# Patient Record
Sex: Female | Born: 1960 | Race: White | Hispanic: No
Health system: Southern US, Community
[De-identification: ages and names within clinical notes are randomized; demographics above are authoritative.]

---

## 2013-04-24 ENCOUNTER — Other Ambulatory Visit (HOSPITAL_COMMUNITY): Payer: Self-pay | Admitting: Family Medicine

## 2013-04-24 ENCOUNTER — Ambulatory Visit (HOSPITAL_COMMUNITY)
Admission: RE | Admit: 2013-04-24 | Discharge: 2013-04-24 | Disposition: A | Discharge status: Home / Self Care | Payer: Disability Insurance | Source: Ambulatory Visit | Attending: Family Medicine | Admitting: Family Medicine

## 2013-04-24 DIAGNOSIS — Q762 Congenital spondylolisthesis: Secondary | ICD-10-CM | POA: Insufficient documentation

## 2013-04-24 DIAGNOSIS — M25561 Pain in right knee: Secondary | ICD-10-CM

## 2013-04-24 DIAGNOSIS — M545 Low back pain, unspecified: Secondary | ICD-10-CM

## 2013-04-24 DIAGNOSIS — M1710 Unilateral primary osteoarthritis, unspecified knee: Secondary | ICD-10-CM | POA: Insufficient documentation

## 2013-04-24 DIAGNOSIS — M25569 Pain in unspecified knee: Secondary | ICD-10-CM | POA: Insufficient documentation

## 2013-04-24 DIAGNOSIS — M5137 Other intervertebral disc degeneration, lumbosacral region: Secondary | ICD-10-CM | POA: Insufficient documentation

## 2013-04-24 DIAGNOSIS — M51379 Other intervertebral disc degeneration, lumbosacral region without mention of lumbar back pain or lower extremity pain: Secondary | ICD-10-CM | POA: Insufficient documentation

## 2013-04-24 DIAGNOSIS — IMO0002 Reserved for concepts with insufficient information to code with codable children: Secondary | ICD-10-CM | POA: Insufficient documentation

## 2014-05-19 ENCOUNTER — Other Ambulatory Visit (HOSPITAL_COMMUNITY): Payer: Self-pay | Admitting: Physician Assistant

## 2014-05-19 DIAGNOSIS — Z1231 Encounter for screening mammogram for malignant neoplasm of breast: Secondary | ICD-10-CM

## 2014-06-18 ENCOUNTER — Ambulatory Visit (HOSPITAL_COMMUNITY): Payer: Disability Insurance

## 2014-06-29 ENCOUNTER — Ambulatory Visit (HOSPITAL_COMMUNITY)
Admission: RE | Admit: 2014-06-29 | Discharge: 2014-06-29 | Disposition: A | Discharge status: Home / Self Care | Payer: Medicaid Other | Source: Ambulatory Visit | Attending: Physician Assistant | Admitting: Physician Assistant

## 2014-06-29 DIAGNOSIS — Z1231 Encounter for screening mammogram for malignant neoplasm of breast: Secondary | ICD-10-CM | POA: Diagnosis not present

## 2014-07-19 DIAGNOSIS — Z9889 Other specified postprocedural states: Secondary | ICD-10-CM | POA: Diagnosis not present

## 2014-07-19 DIAGNOSIS — R200 Anesthesia of skin: Secondary | ICD-10-CM | POA: Diagnosis not present

## 2014-07-19 DIAGNOSIS — R51 Headache: Secondary | ICD-10-CM | POA: Diagnosis not present

## 2014-07-20 DIAGNOSIS — R479 Unspecified speech disturbances: Secondary | ICD-10-CM | POA: Diagnosis not present

## 2014-07-20 DIAGNOSIS — R200 Anesthesia of skin: Secondary | ICD-10-CM | POA: Diagnosis not present

## 2014-10-20 ENCOUNTER — Ambulatory Visit: Payer: Self-pay | Admitting: Physician Assistant

## 2016-03-16 DIAGNOSIS — Z0000 Encounter for general adult medical examination without abnormal findings: Secondary | ICD-10-CM | POA: Diagnosis not present

## 2016-05-22 DIAGNOSIS — E854 Organ-limited amyloidosis: Secondary | ICD-10-CM | POA: Diagnosis not present

## 2016-05-22 DIAGNOSIS — G939 Disorder of brain, unspecified: Secondary | ICD-10-CM | POA: Diagnosis not present

## 2016-05-22 DIAGNOSIS — I680 Cerebral amyloid angiopathy: Secondary | ICD-10-CM | POA: Diagnosis not present

## 2016-05-22 DIAGNOSIS — Z8679 Personal history of other diseases of the circulatory system: Secondary | ICD-10-CM | POA: Diagnosis not present

## 2016-05-22 DIAGNOSIS — F321 Major depressive disorder, single episode, moderate: Secondary | ICD-10-CM | POA: Diagnosis not present

## 2016-05-22 DIAGNOSIS — R51 Headache: Secondary | ICD-10-CM | POA: Diagnosis not present

## 2016-05-22 DIAGNOSIS — I10 Essential (primary) hypertension: Secondary | ICD-10-CM | POA: Diagnosis not present

## 2016-06-01 DIAGNOSIS — R51 Headache: Secondary | ICD-10-CM | POA: Diagnosis not present

## 2016-06-01 DIAGNOSIS — R9082 White matter disease, unspecified: Secondary | ICD-10-CM | POA: Diagnosis not present

## 2016-06-01 DIAGNOSIS — R413 Other amnesia: Secondary | ICD-10-CM | POA: Diagnosis not present

## 2016-06-01 DIAGNOSIS — I639 Cerebral infarction, unspecified: Secondary | ICD-10-CM | POA: Diagnosis not present

## 2016-06-01 DIAGNOSIS — R419 Unspecified symptoms and signs involving cognitive functions and awareness: Secondary | ICD-10-CM | POA: Diagnosis not present

## 2016-06-01 DIAGNOSIS — I638 Other cerebral infarction: Secondary | ICD-10-CM | POA: Diagnosis not present

## 2016-06-13 DIAGNOSIS — I10 Essential (primary) hypertension: Secondary | ICD-10-CM | POA: Diagnosis not present

## 2016-06-13 DIAGNOSIS — F321 Major depressive disorder, single episode, moderate: Secondary | ICD-10-CM | POA: Diagnosis not present

## 2016-06-13 DIAGNOSIS — R0602 Shortness of breath: Secondary | ICD-10-CM | POA: Diagnosis not present

## 2016-06-13 DIAGNOSIS — I6340 Cerebral infarction due to embolism of unspecified cerebral artery: Secondary | ICD-10-CM | POA: Diagnosis not present

## 2016-06-13 DIAGNOSIS — E854 Organ-limited amyloidosis: Secondary | ICD-10-CM | POA: Diagnosis not present

## 2016-06-13 DIAGNOSIS — I680 Cerebral amyloid angiopathy: Secondary | ICD-10-CM | POA: Diagnosis not present

## 2016-06-13 DIAGNOSIS — R269 Unspecified abnormalities of gait and mobility: Secondary | ICD-10-CM | POA: Diagnosis not present

## 2016-06-19 DIAGNOSIS — I10 Essential (primary) hypertension: Secondary | ICD-10-CM | POA: Diagnosis not present

## 2016-06-19 DIAGNOSIS — I69051 Hemiplegia and hemiparesis following nontraumatic subarachnoid hemorrhage affecting right dominant side: Secondary | ICD-10-CM | POA: Diagnosis not present

## 2016-06-20 DIAGNOSIS — I517 Cardiomegaly: Secondary | ICD-10-CM | POA: Diagnosis not present

## 2016-06-20 DIAGNOSIS — I639 Cerebral infarction, unspecified: Secondary | ICD-10-CM | POA: Diagnosis not present

## 2016-06-22 DIAGNOSIS — R531 Weakness: Secondary | ICD-10-CM | POA: Diagnosis not present

## 2016-06-22 DIAGNOSIS — R262 Difficulty in walking, not elsewhere classified: Secondary | ICD-10-CM | POA: Diagnosis not present

## 2016-06-22 DIAGNOSIS — R2681 Unsteadiness on feet: Secondary | ICD-10-CM | POA: Diagnosis not present

## 2016-06-22 DIAGNOSIS — M6281 Muscle weakness (generalized): Secondary | ICD-10-CM | POA: Diagnosis not present

## 2016-06-26 DIAGNOSIS — R262 Difficulty in walking, not elsewhere classified: Secondary | ICD-10-CM | POA: Diagnosis not present

## 2016-06-26 DIAGNOSIS — R531 Weakness: Secondary | ICD-10-CM | POA: Diagnosis not present

## 2016-06-26 DIAGNOSIS — R2681 Unsteadiness on feet: Secondary | ICD-10-CM | POA: Diagnosis not present

## 2016-06-26 DIAGNOSIS — M6281 Muscle weakness (generalized): Secondary | ICD-10-CM | POA: Diagnosis not present

## 2016-06-28 DIAGNOSIS — R2681 Unsteadiness on feet: Secondary | ICD-10-CM | POA: Diagnosis not present

## 2016-06-28 DIAGNOSIS — R531 Weakness: Secondary | ICD-10-CM | POA: Diagnosis not present

## 2016-06-28 DIAGNOSIS — R262 Difficulty in walking, not elsewhere classified: Secondary | ICD-10-CM | POA: Diagnosis not present

## 2016-06-28 DIAGNOSIS — M6281 Muscle weakness (generalized): Secondary | ICD-10-CM | POA: Diagnosis not present

## 2016-06-29 DIAGNOSIS — R2681 Unsteadiness on feet: Secondary | ICD-10-CM | POA: Diagnosis not present

## 2016-06-29 DIAGNOSIS — R262 Difficulty in walking, not elsewhere classified: Secondary | ICD-10-CM | POA: Diagnosis not present

## 2016-06-29 DIAGNOSIS — M6281 Muscle weakness (generalized): Secondary | ICD-10-CM | POA: Diagnosis not present

## 2016-06-29 DIAGNOSIS — R531 Weakness: Secondary | ICD-10-CM | POA: Diagnosis not present

## 2016-07-03 DIAGNOSIS — R2681 Unsteadiness on feet: Secondary | ICD-10-CM | POA: Diagnosis not present

## 2016-07-03 DIAGNOSIS — R531 Weakness: Secondary | ICD-10-CM | POA: Diagnosis not present

## 2016-07-03 DIAGNOSIS — I10 Essential (primary) hypertension: Secondary | ICD-10-CM | POA: Diagnosis not present

## 2016-07-03 DIAGNOSIS — R262 Difficulty in walking, not elsewhere classified: Secondary | ICD-10-CM | POA: Diagnosis not present

## 2016-07-03 DIAGNOSIS — M6281 Muscle weakness (generalized): Secondary | ICD-10-CM | POA: Diagnosis not present

## 2016-07-03 DIAGNOSIS — F250 Schizoaffective disorder, bipolar type: Secondary | ICD-10-CM | POA: Diagnosis not present

## 2016-07-04 DIAGNOSIS — I6340 Cerebral infarction due to embolism of unspecified cerebral artery: Secondary | ICD-10-CM | POA: Diagnosis not present

## 2016-07-06 DIAGNOSIS — I498 Other specified cardiac arrhythmias: Secondary | ICD-10-CM | POA: Diagnosis not present

## 2016-07-06 DIAGNOSIS — R2681 Unsteadiness on feet: Secondary | ICD-10-CM | POA: Diagnosis not present

## 2016-07-06 DIAGNOSIS — R262 Difficulty in walking, not elsewhere classified: Secondary | ICD-10-CM | POA: Diagnosis not present

## 2016-07-06 DIAGNOSIS — M6281 Muscle weakness (generalized): Secondary | ICD-10-CM | POA: Diagnosis not present

## 2016-07-06 DIAGNOSIS — R531 Weakness: Secondary | ICD-10-CM | POA: Diagnosis not present

## 2016-07-10 DIAGNOSIS — R262 Difficulty in walking, not elsewhere classified: Secondary | ICD-10-CM | POA: Diagnosis not present

## 2016-07-10 DIAGNOSIS — M6281 Muscle weakness (generalized): Secondary | ICD-10-CM | POA: Diagnosis not present

## 2016-07-10 DIAGNOSIS — R2681 Unsteadiness on feet: Secondary | ICD-10-CM | POA: Diagnosis not present

## 2016-07-10 DIAGNOSIS — R531 Weakness: Secondary | ICD-10-CM | POA: Diagnosis not present

## 2016-07-11 DIAGNOSIS — R262 Difficulty in walking, not elsewhere classified: Secondary | ICD-10-CM | POA: Diagnosis not present

## 2016-07-11 DIAGNOSIS — R2681 Unsteadiness on feet: Secondary | ICD-10-CM | POA: Diagnosis not present

## 2016-07-11 DIAGNOSIS — R531 Weakness: Secondary | ICD-10-CM | POA: Diagnosis not present

## 2016-07-11 DIAGNOSIS — M6281 Muscle weakness (generalized): Secondary | ICD-10-CM | POA: Diagnosis not present

## 2016-07-13 DIAGNOSIS — M6281 Muscle weakness (generalized): Secondary | ICD-10-CM | POA: Diagnosis not present

## 2016-07-13 DIAGNOSIS — R2681 Unsteadiness on feet: Secondary | ICD-10-CM | POA: Diagnosis not present

## 2016-07-13 DIAGNOSIS — R262 Difficulty in walking, not elsewhere classified: Secondary | ICD-10-CM | POA: Diagnosis not present

## 2016-07-13 DIAGNOSIS — R531 Weakness: Secondary | ICD-10-CM | POA: Diagnosis not present

## 2016-07-17 DIAGNOSIS — R262 Difficulty in walking, not elsewhere classified: Secondary | ICD-10-CM | POA: Diagnosis not present

## 2016-07-17 DIAGNOSIS — R531 Weakness: Secondary | ICD-10-CM | POA: Diagnosis not present

## 2016-07-17 DIAGNOSIS — M6281 Muscle weakness (generalized): Secondary | ICD-10-CM | POA: Diagnosis not present

## 2016-07-17 DIAGNOSIS — R2681 Unsteadiness on feet: Secondary | ICD-10-CM | POA: Diagnosis not present

## 2016-07-19 DIAGNOSIS — R262 Difficulty in walking, not elsewhere classified: Secondary | ICD-10-CM | POA: Diagnosis not present

## 2016-07-19 DIAGNOSIS — I6340 Cerebral infarction due to embolism of unspecified cerebral artery: Secondary | ICD-10-CM | POA: Diagnosis not present

## 2016-07-19 DIAGNOSIS — R2681 Unsteadiness on feet: Secondary | ICD-10-CM | POA: Diagnosis not present

## 2016-07-19 DIAGNOSIS — M6281 Muscle weakness (generalized): Secondary | ICD-10-CM | POA: Diagnosis not present

## 2016-07-19 DIAGNOSIS — R531 Weakness: Secondary | ICD-10-CM | POA: Diagnosis not present

## 2016-07-20 DIAGNOSIS — R2681 Unsteadiness on feet: Secondary | ICD-10-CM | POA: Diagnosis not present

## 2016-07-20 DIAGNOSIS — R531 Weakness: Secondary | ICD-10-CM | POA: Diagnosis not present

## 2016-07-20 DIAGNOSIS — R262 Difficulty in walking, not elsewhere classified: Secondary | ICD-10-CM | POA: Diagnosis not present

## 2016-07-20 DIAGNOSIS — M6281 Muscle weakness (generalized): Secondary | ICD-10-CM | POA: Diagnosis not present

## 2016-07-24 DIAGNOSIS — M6281 Muscle weakness (generalized): Secondary | ICD-10-CM | POA: Diagnosis not present

## 2016-07-24 DIAGNOSIS — R262 Difficulty in walking, not elsewhere classified: Secondary | ICD-10-CM | POA: Diagnosis not present

## 2016-07-24 DIAGNOSIS — R531 Weakness: Secondary | ICD-10-CM | POA: Diagnosis not present

## 2016-07-24 DIAGNOSIS — R2681 Unsteadiness on feet: Secondary | ICD-10-CM | POA: Diagnosis not present

## 2016-07-28 DIAGNOSIS — R4182 Altered mental status, unspecified: Secondary | ICD-10-CM | POA: Diagnosis not present

## 2016-07-28 DIAGNOSIS — M4817 Ankylosing hyperostosis [Forestier], lumbosacral region: Secondary | ICD-10-CM | POA: Diagnosis not present

## 2016-07-28 DIAGNOSIS — R2689 Other abnormalities of gait and mobility: Secondary | ICD-10-CM | POA: Diagnosis not present

## 2016-07-28 DIAGNOSIS — Z8249 Family history of ischemic heart disease and other diseases of the circulatory system: Secondary | ICD-10-CM | POA: Diagnosis not present

## 2016-07-28 DIAGNOSIS — S3992XA Unspecified injury of lower back, initial encounter: Secondary | ICD-10-CM | POA: Diagnosis not present

## 2016-07-28 DIAGNOSIS — S299XXA Unspecified injury of thorax, initial encounter: Secondary | ICD-10-CM | POA: Diagnosis not present

## 2016-07-28 DIAGNOSIS — I10 Essential (primary) hypertension: Secondary | ICD-10-CM | POA: Diagnosis present

## 2016-07-28 DIAGNOSIS — Z79899 Other long term (current) drug therapy: Secondary | ICD-10-CM | POA: Diagnosis not present

## 2016-07-28 DIAGNOSIS — I608 Other nontraumatic subarachnoid hemorrhage: Secondary | ICD-10-CM | POA: Diagnosis not present

## 2016-07-28 DIAGNOSIS — Z886 Allergy status to analgesic agent status: Secondary | ICD-10-CM | POA: Diagnosis not present

## 2016-07-28 DIAGNOSIS — R2681 Unsteadiness on feet: Secondary | ICD-10-CM | POA: Diagnosis not present

## 2016-07-28 DIAGNOSIS — M549 Dorsalgia, unspecified: Secondary | ICD-10-CM | POA: Diagnosis not present

## 2016-07-28 DIAGNOSIS — R531 Weakness: Secondary | ICD-10-CM | POA: Diagnosis not present

## 2016-07-28 DIAGNOSIS — E860 Dehydration: Secondary | ICD-10-CM | POA: Diagnosis present

## 2016-07-28 DIAGNOSIS — Z87891 Personal history of nicotine dependence: Secondary | ICD-10-CM | POA: Diagnosis not present

## 2016-07-28 DIAGNOSIS — R404 Transient alteration of awareness: Secondary | ICD-10-CM | POA: Diagnosis not present

## 2016-07-28 DIAGNOSIS — Z6841 Body Mass Index (BMI) 40.0 and over, adult: Secondary | ICD-10-CM | POA: Diagnosis not present

## 2016-07-28 DIAGNOSIS — F3189 Other bipolar disorder: Secondary | ICD-10-CM | POA: Diagnosis not present

## 2016-07-28 DIAGNOSIS — I6340 Cerebral infarction due to embolism of unspecified cerebral artery: Secondary | ICD-10-CM | POA: Diagnosis not present

## 2016-07-28 DIAGNOSIS — M6281 Muscle weakness (generalized): Secondary | ICD-10-CM | POA: Diagnosis not present

## 2016-07-28 DIAGNOSIS — F419 Anxiety disorder, unspecified: Secondary | ICD-10-CM | POA: Diagnosis present

## 2016-07-28 DIAGNOSIS — Z888 Allergy status to other drugs, medicaments and biological substances status: Secondary | ICD-10-CM | POA: Diagnosis not present

## 2016-07-28 DIAGNOSIS — Z743 Need for continuous supervision: Secondary | ICD-10-CM | POA: Diagnosis not present

## 2016-07-28 DIAGNOSIS — Z833 Family history of diabetes mellitus: Secondary | ICD-10-CM | POA: Diagnosis not present

## 2016-07-28 DIAGNOSIS — R279 Unspecified lack of coordination: Secondary | ICD-10-CM | POA: Diagnosis not present

## 2016-07-28 DIAGNOSIS — Z91018 Allergy to other foods: Secondary | ICD-10-CM | POA: Diagnosis not present

## 2016-07-28 DIAGNOSIS — G459 Transient cerebral ischemic attack, unspecified: Secondary | ICD-10-CM | POA: Diagnosis not present

## 2016-07-28 DIAGNOSIS — M4816 Ankylosing hyperostosis [Forestier], lumbar region: Secondary | ICD-10-CM | POA: Diagnosis present

## 2016-07-28 DIAGNOSIS — Z8673 Personal history of transient ischemic attack (TIA), and cerebral infarction without residual deficits: Secondary | ICD-10-CM | POA: Diagnosis not present

## 2016-07-28 DIAGNOSIS — G43819 Other migraine, intractable, without status migrainosus: Secondary | ICD-10-CM | POA: Diagnosis not present

## 2016-07-28 DIAGNOSIS — R41841 Cognitive communication deficit: Secondary | ICD-10-CM | POA: Diagnosis not present

## 2016-07-28 DIAGNOSIS — F29 Unspecified psychosis not due to a substance or known physiological condition: Secondary | ICD-10-CM | POA: Diagnosis not present

## 2016-07-28 DIAGNOSIS — Z882 Allergy status to sulfonamides status: Secondary | ICD-10-CM | POA: Diagnosis not present

## 2016-07-28 DIAGNOSIS — F312 Bipolar disorder, current episode manic severe with psychotic features: Secondary | ICD-10-CM | POA: Diagnosis present

## 2016-07-31 DIAGNOSIS — I6340 Cerebral infarction due to embolism of unspecified cerebral artery: Secondary | ICD-10-CM | POA: Diagnosis not present

## 2016-08-03 DIAGNOSIS — D518 Other vitamin B12 deficiency anemias: Secondary | ICD-10-CM | POA: Diagnosis not present

## 2016-08-03 DIAGNOSIS — R2689 Other abnormalities of gait and mobility: Secondary | ICD-10-CM | POA: Diagnosis not present

## 2016-08-03 DIAGNOSIS — E119 Type 2 diabetes mellitus without complications: Secondary | ICD-10-CM | POA: Diagnosis not present

## 2016-08-03 DIAGNOSIS — G43819 Other migraine, intractable, without status migrainosus: Secondary | ICD-10-CM | POA: Diagnosis not present

## 2016-08-03 DIAGNOSIS — R41841 Cognitive communication deficit: Secondary | ICD-10-CM | POA: Diagnosis not present

## 2016-08-03 DIAGNOSIS — I670 Dissection of cerebral arteries, nonruptured: Secondary | ICD-10-CM | POA: Diagnosis not present

## 2016-08-03 DIAGNOSIS — Z79899 Other long term (current) drug therapy: Secondary | ICD-10-CM | POA: Diagnosis not present

## 2016-08-03 DIAGNOSIS — F312 Bipolar disorder, current episode manic severe with psychotic features: Secondary | ICD-10-CM | POA: Diagnosis not present

## 2016-08-03 DIAGNOSIS — E782 Mixed hyperlipidemia: Secondary | ICD-10-CM | POA: Diagnosis not present

## 2016-08-03 DIAGNOSIS — R4182 Altered mental status, unspecified: Secondary | ICD-10-CM | POA: Diagnosis not present

## 2016-08-03 DIAGNOSIS — Z6841 Body Mass Index (BMI) 40.0 and over, adult: Secondary | ICD-10-CM | POA: Diagnosis not present

## 2016-08-03 DIAGNOSIS — Z743 Need for continuous supervision: Secondary | ICD-10-CM | POA: Diagnosis not present

## 2016-08-03 DIAGNOSIS — M47814 Spondylosis without myelopathy or radiculopathy, thoracic region: Secondary | ICD-10-CM | POA: Diagnosis not present

## 2016-08-03 DIAGNOSIS — M4816 Ankylosing hyperostosis [Forestier], lumbar region: Secondary | ICD-10-CM | POA: Diagnosis not present

## 2016-08-03 DIAGNOSIS — R4701 Aphasia: Secondary | ICD-10-CM | POA: Diagnosis not present

## 2016-08-03 DIAGNOSIS — R2681 Unsteadiness on feet: Secondary | ICD-10-CM | POA: Diagnosis not present

## 2016-08-03 DIAGNOSIS — I608 Other nontraumatic subarachnoid hemorrhage: Secondary | ICD-10-CM | POA: Diagnosis not present

## 2016-08-03 DIAGNOSIS — M6281 Muscle weakness (generalized): Secondary | ICD-10-CM | POA: Diagnosis not present

## 2016-08-03 DIAGNOSIS — R279 Unspecified lack of coordination: Secondary | ICD-10-CM | POA: Diagnosis not present

## 2016-08-03 DIAGNOSIS — F3189 Other bipolar disorder: Secondary | ICD-10-CM | POA: Diagnosis not present

## 2016-08-03 DIAGNOSIS — E860 Dehydration: Secondary | ICD-10-CM | POA: Diagnosis not present

## 2016-08-03 DIAGNOSIS — F419 Anxiety disorder, unspecified: Secondary | ICD-10-CM | POA: Diagnosis not present

## 2016-08-03 DIAGNOSIS — F29 Unspecified psychosis not due to a substance or known physiological condition: Secondary | ICD-10-CM | POA: Diagnosis not present

## 2016-08-03 DIAGNOSIS — I10 Essential (primary) hypertension: Secondary | ICD-10-CM | POA: Diagnosis not present

## 2016-08-03 DIAGNOSIS — M4817 Ankylosing hyperostosis [Forestier], lumbosacral region: Secondary | ICD-10-CM | POA: Diagnosis not present

## 2016-08-03 DIAGNOSIS — F329 Major depressive disorder, single episode, unspecified: Secondary | ICD-10-CM | POA: Diagnosis not present

## 2016-08-07 DIAGNOSIS — M47814 Spondylosis without myelopathy or radiculopathy, thoracic region: Secondary | ICD-10-CM | POA: Diagnosis not present

## 2016-08-07 DIAGNOSIS — F3189 Other bipolar disorder: Secondary | ICD-10-CM | POA: Diagnosis not present

## 2016-08-07 DIAGNOSIS — E860 Dehydration: Secondary | ICD-10-CM | POA: Diagnosis not present

## 2016-08-07 DIAGNOSIS — I10 Essential (primary) hypertension: Secondary | ICD-10-CM | POA: Diagnosis not present

## 2016-08-13 DIAGNOSIS — I670 Dissection of cerebral arteries, nonruptured: Secondary | ICD-10-CM | POA: Diagnosis not present

## 2016-08-13 DIAGNOSIS — R4701 Aphasia: Secondary | ICD-10-CM | POA: Diagnosis not present

## 2016-08-13 DIAGNOSIS — I10 Essential (primary) hypertension: Secondary | ICD-10-CM | POA: Diagnosis not present

## 2016-08-13 DIAGNOSIS — F329 Major depressive disorder, single episode, unspecified: Secondary | ICD-10-CM | POA: Diagnosis not present

## 2016-08-14 DIAGNOSIS — I10 Essential (primary) hypertension: Secondary | ICD-10-CM | POA: Diagnosis not present

## 2016-08-14 DIAGNOSIS — F329 Major depressive disorder, single episode, unspecified: Secondary | ICD-10-CM | POA: Diagnosis not present

## 2016-08-14 DIAGNOSIS — M47814 Spondylosis without myelopathy or radiculopathy, thoracic region: Secondary | ICD-10-CM | POA: Diagnosis not present

## 2016-08-14 DIAGNOSIS — R4701 Aphasia: Secondary | ICD-10-CM | POA: Diagnosis not present

## 2016-08-16 DIAGNOSIS — I69311 Memory deficit following cerebral infarction: Secondary | ICD-10-CM | POA: Diagnosis not present

## 2016-08-16 DIAGNOSIS — Z9181 History of falling: Secondary | ICD-10-CM | POA: Diagnosis not present

## 2016-08-16 DIAGNOSIS — F3189 Other bipolar disorder: Secondary | ICD-10-CM | POA: Diagnosis not present

## 2016-08-16 DIAGNOSIS — I10 Essential (primary) hypertension: Secondary | ICD-10-CM | POA: Diagnosis not present

## 2016-08-16 DIAGNOSIS — Z6841 Body Mass Index (BMI) 40.0 and over, adult: Secondary | ICD-10-CM | POA: Diagnosis not present

## 2016-08-16 DIAGNOSIS — G43819 Other migraine, intractable, without status migrainosus: Secondary | ICD-10-CM | POA: Diagnosis not present

## 2016-08-16 DIAGNOSIS — M47814 Spondylosis without myelopathy or radiculopathy, thoracic region: Secondary | ICD-10-CM | POA: Diagnosis not present

## 2016-08-16 DIAGNOSIS — F418 Other specified anxiety disorders: Secondary | ICD-10-CM | POA: Diagnosis not present

## 2016-08-16 DIAGNOSIS — Z8679 Personal history of other diseases of the circulatory system: Secondary | ICD-10-CM | POA: Diagnosis not present

## 2016-08-16 DIAGNOSIS — M6281 Muscle weakness (generalized): Secondary | ICD-10-CM | POA: Diagnosis not present

## 2016-08-16 DIAGNOSIS — M4817 Ankylosing hyperostosis [Forestier], lumbosacral region: Secondary | ICD-10-CM | POA: Diagnosis not present

## 2016-08-16 DIAGNOSIS — Z87891 Personal history of nicotine dependence: Secondary | ICD-10-CM | POA: Diagnosis not present

## 2016-08-17 DIAGNOSIS — M47814 Spondylosis without myelopathy or radiculopathy, thoracic region: Secondary | ICD-10-CM | POA: Diagnosis not present

## 2016-08-17 DIAGNOSIS — I69311 Memory deficit following cerebral infarction: Secondary | ICD-10-CM | POA: Diagnosis not present

## 2016-08-17 DIAGNOSIS — F3189 Other bipolar disorder: Secondary | ICD-10-CM | POA: Diagnosis not present

## 2016-08-17 DIAGNOSIS — M4817 Ankylosing hyperostosis [Forestier], lumbosacral region: Secondary | ICD-10-CM | POA: Diagnosis not present

## 2016-08-17 DIAGNOSIS — I10 Essential (primary) hypertension: Secondary | ICD-10-CM | POA: Diagnosis not present

## 2016-08-17 DIAGNOSIS — M6281 Muscle weakness (generalized): Secondary | ICD-10-CM | POA: Diagnosis not present

## 2016-08-22 DIAGNOSIS — I10 Essential (primary) hypertension: Secondary | ICD-10-CM | POA: Diagnosis not present

## 2016-08-22 DIAGNOSIS — F3189 Other bipolar disorder: Secondary | ICD-10-CM | POA: Diagnosis not present

## 2016-08-22 DIAGNOSIS — I69311 Memory deficit following cerebral infarction: Secondary | ICD-10-CM | POA: Diagnosis not present

## 2016-08-22 DIAGNOSIS — M47814 Spondylosis without myelopathy or radiculopathy, thoracic region: Secondary | ICD-10-CM | POA: Diagnosis not present

## 2016-08-22 DIAGNOSIS — M6281 Muscle weakness (generalized): Secondary | ICD-10-CM | POA: Diagnosis not present

## 2016-08-22 DIAGNOSIS — M4817 Ankylosing hyperostosis [Forestier], lumbosacral region: Secondary | ICD-10-CM | POA: Diagnosis not present

## 2016-08-23 DIAGNOSIS — M4817 Ankylosing hyperostosis [Forestier], lumbosacral region: Secondary | ICD-10-CM | POA: Diagnosis not present

## 2016-08-23 DIAGNOSIS — M6281 Muscle weakness (generalized): Secondary | ICD-10-CM | POA: Diagnosis not present

## 2016-08-23 DIAGNOSIS — I69311 Memory deficit following cerebral infarction: Secondary | ICD-10-CM | POA: Diagnosis not present

## 2016-08-23 DIAGNOSIS — I10 Essential (primary) hypertension: Secondary | ICD-10-CM | POA: Diagnosis not present

## 2016-08-23 DIAGNOSIS — F3189 Other bipolar disorder: Secondary | ICD-10-CM | POA: Diagnosis not present

## 2016-08-23 DIAGNOSIS — M47814 Spondylosis without myelopathy or radiculopathy, thoracic region: Secondary | ICD-10-CM | POA: Diagnosis not present

## 2016-08-24 DIAGNOSIS — F250 Schizoaffective disorder, bipolar type: Secondary | ICD-10-CM | POA: Diagnosis not present

## 2016-08-24 DIAGNOSIS — I6789 Other cerebrovascular disease: Secondary | ICD-10-CM | POA: Diagnosis not present

## 2016-08-24 DIAGNOSIS — I10 Essential (primary) hypertension: Secondary | ICD-10-CM | POA: Diagnosis not present

## 2016-08-25 DIAGNOSIS — M6281 Muscle weakness (generalized): Secondary | ICD-10-CM | POA: Diagnosis not present

## 2016-08-25 DIAGNOSIS — M4817 Ankylosing hyperostosis [Forestier], lumbosacral region: Secondary | ICD-10-CM | POA: Diagnosis not present

## 2016-08-25 DIAGNOSIS — I10 Essential (primary) hypertension: Secondary | ICD-10-CM | POA: Diagnosis not present

## 2016-08-25 DIAGNOSIS — F3189 Other bipolar disorder: Secondary | ICD-10-CM | POA: Diagnosis not present

## 2016-08-25 DIAGNOSIS — I69311 Memory deficit following cerebral infarction: Secondary | ICD-10-CM | POA: Diagnosis not present

## 2016-08-25 DIAGNOSIS — M47814 Spondylosis without myelopathy or radiculopathy, thoracic region: Secondary | ICD-10-CM | POA: Diagnosis not present

## 2016-08-28 DIAGNOSIS — M47814 Spondylosis without myelopathy or radiculopathy, thoracic region: Secondary | ICD-10-CM | POA: Diagnosis not present

## 2016-08-28 DIAGNOSIS — M4817 Ankylosing hyperostosis [Forestier], lumbosacral region: Secondary | ICD-10-CM | POA: Diagnosis not present

## 2016-08-28 DIAGNOSIS — F3189 Other bipolar disorder: Secondary | ICD-10-CM | POA: Diagnosis not present

## 2016-08-28 DIAGNOSIS — M6281 Muscle weakness (generalized): Secondary | ICD-10-CM | POA: Diagnosis not present

## 2016-08-28 DIAGNOSIS — I69311 Memory deficit following cerebral infarction: Secondary | ICD-10-CM | POA: Diagnosis not present

## 2016-08-28 DIAGNOSIS — I10 Essential (primary) hypertension: Secondary | ICD-10-CM | POA: Diagnosis not present

## 2016-08-29 DIAGNOSIS — I69311 Memory deficit following cerebral infarction: Secondary | ICD-10-CM | POA: Diagnosis not present

## 2016-08-29 DIAGNOSIS — M6281 Muscle weakness (generalized): Secondary | ICD-10-CM | POA: Diagnosis not present

## 2016-08-29 DIAGNOSIS — M47814 Spondylosis without myelopathy or radiculopathy, thoracic region: Secondary | ICD-10-CM | POA: Diagnosis not present

## 2016-08-29 DIAGNOSIS — I10 Essential (primary) hypertension: Secondary | ICD-10-CM | POA: Diagnosis not present

## 2016-08-29 DIAGNOSIS — M4817 Ankylosing hyperostosis [Forestier], lumbosacral region: Secondary | ICD-10-CM | POA: Diagnosis not present

## 2016-08-29 DIAGNOSIS — F3189 Other bipolar disorder: Secondary | ICD-10-CM | POA: Diagnosis not present

## 2016-08-31 DIAGNOSIS — I10 Essential (primary) hypertension: Secondary | ICD-10-CM | POA: Diagnosis not present

## 2016-08-31 DIAGNOSIS — I69311 Memory deficit following cerebral infarction: Secondary | ICD-10-CM | POA: Diagnosis not present

## 2016-08-31 DIAGNOSIS — M47814 Spondylosis without myelopathy or radiculopathy, thoracic region: Secondary | ICD-10-CM | POA: Diagnosis not present

## 2016-08-31 DIAGNOSIS — M6281 Muscle weakness (generalized): Secondary | ICD-10-CM | POA: Diagnosis not present

## 2016-08-31 DIAGNOSIS — M4817 Ankylosing hyperostosis [Forestier], lumbosacral region: Secondary | ICD-10-CM | POA: Diagnosis not present

## 2016-08-31 DIAGNOSIS — F3189 Other bipolar disorder: Secondary | ICD-10-CM | POA: Diagnosis not present

## 2016-09-01 DIAGNOSIS — M47814 Spondylosis without myelopathy or radiculopathy, thoracic region: Secondary | ICD-10-CM | POA: Diagnosis not present

## 2016-09-01 DIAGNOSIS — M6281 Muscle weakness (generalized): Secondary | ICD-10-CM | POA: Diagnosis not present

## 2016-09-01 DIAGNOSIS — I69311 Memory deficit following cerebral infarction: Secondary | ICD-10-CM | POA: Diagnosis not present

## 2016-09-01 DIAGNOSIS — M4817 Ankylosing hyperostosis [Forestier], lumbosacral region: Secondary | ICD-10-CM | POA: Diagnosis not present

## 2016-09-01 DIAGNOSIS — I10 Essential (primary) hypertension: Secondary | ICD-10-CM | POA: Diagnosis not present

## 2016-09-01 DIAGNOSIS — F3189 Other bipolar disorder: Secondary | ICD-10-CM | POA: Diagnosis not present

## 2016-09-05 DIAGNOSIS — M6281 Muscle weakness (generalized): Secondary | ICD-10-CM | POA: Diagnosis not present

## 2016-09-05 DIAGNOSIS — F3189 Other bipolar disorder: Secondary | ICD-10-CM | POA: Diagnosis not present

## 2016-09-05 DIAGNOSIS — I69311 Memory deficit following cerebral infarction: Secondary | ICD-10-CM | POA: Diagnosis not present

## 2016-09-05 DIAGNOSIS — M4817 Ankylosing hyperostosis [Forestier], lumbosacral region: Secondary | ICD-10-CM | POA: Diagnosis not present

## 2016-09-05 DIAGNOSIS — M47814 Spondylosis without myelopathy or radiculopathy, thoracic region: Secondary | ICD-10-CM | POA: Diagnosis not present

## 2016-09-05 DIAGNOSIS — I10 Essential (primary) hypertension: Secondary | ICD-10-CM | POA: Diagnosis not present

## 2016-09-06 DIAGNOSIS — M6281 Muscle weakness (generalized): Secondary | ICD-10-CM | POA: Diagnosis not present

## 2016-09-06 DIAGNOSIS — M4817 Ankylosing hyperostosis [Forestier], lumbosacral region: Secondary | ICD-10-CM | POA: Diagnosis not present

## 2016-09-06 DIAGNOSIS — I10 Essential (primary) hypertension: Secondary | ICD-10-CM | POA: Diagnosis not present

## 2016-09-06 DIAGNOSIS — M47814 Spondylosis without myelopathy or radiculopathy, thoracic region: Secondary | ICD-10-CM | POA: Diagnosis not present

## 2016-09-06 DIAGNOSIS — I69311 Memory deficit following cerebral infarction: Secondary | ICD-10-CM | POA: Diagnosis not present

## 2016-09-06 DIAGNOSIS — F3189 Other bipolar disorder: Secondary | ICD-10-CM | POA: Diagnosis not present

## 2016-09-13 DIAGNOSIS — M6281 Muscle weakness (generalized): Secondary | ICD-10-CM | POA: Diagnosis not present

## 2016-09-13 DIAGNOSIS — F3189 Other bipolar disorder: Secondary | ICD-10-CM | POA: Diagnosis not present

## 2016-09-13 DIAGNOSIS — I69311 Memory deficit following cerebral infarction: Secondary | ICD-10-CM | POA: Diagnosis not present

## 2016-09-13 DIAGNOSIS — M47814 Spondylosis without myelopathy or radiculopathy, thoracic region: Secondary | ICD-10-CM | POA: Diagnosis not present

## 2016-09-13 DIAGNOSIS — I10 Essential (primary) hypertension: Secondary | ICD-10-CM | POA: Diagnosis not present

## 2016-09-13 DIAGNOSIS — M4817 Ankylosing hyperostosis [Forestier], lumbosacral region: Secondary | ICD-10-CM | POA: Diagnosis not present

## 2016-09-14 DIAGNOSIS — M6281 Muscle weakness (generalized): Secondary | ICD-10-CM | POA: Diagnosis not present

## 2016-09-14 DIAGNOSIS — I69311 Memory deficit following cerebral infarction: Secondary | ICD-10-CM | POA: Diagnosis not present

## 2016-09-14 DIAGNOSIS — F3189 Other bipolar disorder: Secondary | ICD-10-CM | POA: Diagnosis not present

## 2016-09-14 DIAGNOSIS — I10 Essential (primary) hypertension: Secondary | ICD-10-CM | POA: Diagnosis not present

## 2016-09-14 DIAGNOSIS — M47814 Spondylosis without myelopathy or radiculopathy, thoracic region: Secondary | ICD-10-CM | POA: Diagnosis not present

## 2016-09-14 DIAGNOSIS — M4817 Ankylosing hyperostosis [Forestier], lumbosacral region: Secondary | ICD-10-CM | POA: Diagnosis not present

## 2016-09-18 DIAGNOSIS — M47814 Spondylosis without myelopathy or radiculopathy, thoracic region: Secondary | ICD-10-CM | POA: Diagnosis not present

## 2016-09-18 DIAGNOSIS — M4817 Ankylosing hyperostosis [Forestier], lumbosacral region: Secondary | ICD-10-CM | POA: Diagnosis not present

## 2016-09-18 DIAGNOSIS — F3189 Other bipolar disorder: Secondary | ICD-10-CM | POA: Diagnosis not present

## 2016-09-18 DIAGNOSIS — M6281 Muscle weakness (generalized): Secondary | ICD-10-CM | POA: Diagnosis not present

## 2016-09-18 DIAGNOSIS — I69311 Memory deficit following cerebral infarction: Secondary | ICD-10-CM | POA: Diagnosis not present

## 2016-09-18 DIAGNOSIS — I10 Essential (primary) hypertension: Secondary | ICD-10-CM | POA: Diagnosis not present

## 2016-09-27 DIAGNOSIS — M4817 Ankylosing hyperostosis [Forestier], lumbosacral region: Secondary | ICD-10-CM | POA: Diagnosis not present

## 2016-09-27 DIAGNOSIS — I10 Essential (primary) hypertension: Secondary | ICD-10-CM | POA: Diagnosis not present

## 2016-09-27 DIAGNOSIS — F3189 Other bipolar disorder: Secondary | ICD-10-CM | POA: Diagnosis not present

## 2016-09-27 DIAGNOSIS — M6281 Muscle weakness (generalized): Secondary | ICD-10-CM | POA: Diagnosis not present

## 2016-09-27 DIAGNOSIS — I69311 Memory deficit following cerebral infarction: Secondary | ICD-10-CM | POA: Diagnosis not present

## 2016-09-27 DIAGNOSIS — M47814 Spondylosis without myelopathy or radiculopathy, thoracic region: Secondary | ICD-10-CM | POA: Diagnosis not present

## 2016-09-29 DIAGNOSIS — M47814 Spondylosis without myelopathy or radiculopathy, thoracic region: Secondary | ICD-10-CM | POA: Diagnosis not present

## 2016-09-29 DIAGNOSIS — I10 Essential (primary) hypertension: Secondary | ICD-10-CM | POA: Diagnosis not present

## 2016-09-29 DIAGNOSIS — M6281 Muscle weakness (generalized): Secondary | ICD-10-CM | POA: Diagnosis not present

## 2016-09-29 DIAGNOSIS — I69311 Memory deficit following cerebral infarction: Secondary | ICD-10-CM | POA: Diagnosis not present

## 2016-09-29 DIAGNOSIS — M4817 Ankylosing hyperostosis [Forestier], lumbosacral region: Secondary | ICD-10-CM | POA: Diagnosis not present

## 2016-09-29 DIAGNOSIS — F3189 Other bipolar disorder: Secondary | ICD-10-CM | POA: Diagnosis not present

## 2016-10-04 DIAGNOSIS — F3189 Other bipolar disorder: Secondary | ICD-10-CM | POA: Diagnosis not present

## 2016-10-04 DIAGNOSIS — I10 Essential (primary) hypertension: Secondary | ICD-10-CM | POA: Diagnosis not present

## 2016-10-04 DIAGNOSIS — I69311 Memory deficit following cerebral infarction: Secondary | ICD-10-CM | POA: Diagnosis not present

## 2016-10-04 DIAGNOSIS — M4817 Ankylosing hyperostosis [Forestier], lumbosacral region: Secondary | ICD-10-CM | POA: Diagnosis not present

## 2016-10-04 DIAGNOSIS — M47814 Spondylosis without myelopathy or radiculopathy, thoracic region: Secondary | ICD-10-CM | POA: Diagnosis not present

## 2016-10-04 DIAGNOSIS — M6281 Muscle weakness (generalized): Secondary | ICD-10-CM | POA: Diagnosis not present

## 2016-10-06 DIAGNOSIS — M47814 Spondylosis without myelopathy or radiculopathy, thoracic region: Secondary | ICD-10-CM | POA: Diagnosis not present

## 2016-10-06 DIAGNOSIS — I10 Essential (primary) hypertension: Secondary | ICD-10-CM | POA: Diagnosis not present

## 2016-10-06 DIAGNOSIS — M4817 Ankylosing hyperostosis [Forestier], lumbosacral region: Secondary | ICD-10-CM | POA: Diagnosis not present

## 2016-10-06 DIAGNOSIS — I69311 Memory deficit following cerebral infarction: Secondary | ICD-10-CM | POA: Diagnosis not present

## 2016-10-06 DIAGNOSIS — F3189 Other bipolar disorder: Secondary | ICD-10-CM | POA: Diagnosis not present

## 2016-10-06 DIAGNOSIS — M6281 Muscle weakness (generalized): Secondary | ICD-10-CM | POA: Diagnosis not present

## 2016-10-11 DIAGNOSIS — M4817 Ankylosing hyperostosis [Forestier], lumbosacral region: Secondary | ICD-10-CM | POA: Diagnosis not present

## 2016-10-11 DIAGNOSIS — I69311 Memory deficit following cerebral infarction: Secondary | ICD-10-CM | POA: Diagnosis not present

## 2016-10-11 DIAGNOSIS — M6281 Muscle weakness (generalized): Secondary | ICD-10-CM | POA: Diagnosis not present

## 2016-10-11 DIAGNOSIS — M47814 Spondylosis without myelopathy or radiculopathy, thoracic region: Secondary | ICD-10-CM | POA: Diagnosis not present

## 2016-10-11 DIAGNOSIS — I10 Essential (primary) hypertension: Secondary | ICD-10-CM | POA: Diagnosis not present

## 2016-10-11 DIAGNOSIS — F3189 Other bipolar disorder: Secondary | ICD-10-CM | POA: Diagnosis not present

## 2016-10-17 DIAGNOSIS — R400 Somnolence: Secondary | ICD-10-CM | POA: Diagnosis not present

## 2016-10-17 DIAGNOSIS — Z8673 Personal history of transient ischemic attack (TIA), and cerebral infarction without residual deficits: Secondary | ICD-10-CM | POA: Diagnosis not present

## 2016-10-17 DIAGNOSIS — I6930 Unspecified sequelae of cerebral infarction: Secondary | ICD-10-CM | POA: Diagnosis not present

## 2016-11-21 DIAGNOSIS — Z8673 Personal history of transient ischemic attack (TIA), and cerebral infarction without residual deficits: Secondary | ICD-10-CM | POA: Diagnosis not present

## 2016-11-21 DIAGNOSIS — I10 Essential (primary) hypertension: Secondary | ICD-10-CM | POA: Diagnosis not present

## 2016-11-21 DIAGNOSIS — F0151 Vascular dementia with behavioral disturbance: Secondary | ICD-10-CM | POA: Diagnosis not present

## 2016-11-21 DIAGNOSIS — I6930 Unspecified sequelae of cerebral infarction: Secondary | ICD-10-CM | POA: Diagnosis not present

## 2016-12-25 DIAGNOSIS — K210 Gastro-esophageal reflux disease with esophagitis: Secondary | ICD-10-CM | POA: Diagnosis not present

## 2016-12-25 DIAGNOSIS — E7849 Other hyperlipidemia: Secondary | ICD-10-CM | POA: Diagnosis not present

## 2016-12-25 DIAGNOSIS — F250 Schizoaffective disorder, bipolar type: Secondary | ICD-10-CM | POA: Diagnosis not present

## 2016-12-25 DIAGNOSIS — I10 Essential (primary) hypertension: Secondary | ICD-10-CM | POA: Diagnosis not present

## 2016-12-27 DIAGNOSIS — F250 Schizoaffective disorder, bipolar type: Secondary | ICD-10-CM | POA: Diagnosis not present

## 2016-12-27 DIAGNOSIS — I10 Essential (primary) hypertension: Secondary | ICD-10-CM | POA: Diagnosis not present

## 2016-12-27 DIAGNOSIS — E7849 Other hyperlipidemia: Secondary | ICD-10-CM | POA: Diagnosis not present

## 2016-12-27 DIAGNOSIS — R7303 Prediabetes: Secondary | ICD-10-CM | POA: Diagnosis not present

## 2016-12-27 DIAGNOSIS — K210 Gastro-esophageal reflux disease with esophagitis: Secondary | ICD-10-CM | POA: Diagnosis not present

## 2017-02-19 DIAGNOSIS — L03211 Cellulitis of face: Secondary | ICD-10-CM | POA: Diagnosis not present

## 2017-03-01 DIAGNOSIS — F39 Unspecified mood [affective] disorder: Secondary | ICD-10-CM | POA: Diagnosis not present

## 2017-03-01 DIAGNOSIS — F0391 Unspecified dementia with behavioral disturbance: Secondary | ICD-10-CM | POA: Diagnosis not present

## 2017-03-01 DIAGNOSIS — F419 Anxiety disorder, unspecified: Secondary | ICD-10-CM | POA: Diagnosis not present

## 2017-03-28 DIAGNOSIS — Z0000 Encounter for general adult medical examination without abnormal findings: Secondary | ICD-10-CM | POA: Diagnosis not present

## 2017-03-28 DIAGNOSIS — I10 Essential (primary) hypertension: Secondary | ICD-10-CM | POA: Diagnosis not present

## 2017-03-28 DIAGNOSIS — G250 Essential tremor: Secondary | ICD-10-CM | POA: Diagnosis not present

## 2017-03-28 DIAGNOSIS — I6789 Other cerebrovascular disease: Secondary | ICD-10-CM | POA: Diagnosis not present

## 2017-03-28 DIAGNOSIS — F3189 Other bipolar disorder: Secondary | ICD-10-CM | POA: Diagnosis not present

## 2017-04-03 DIAGNOSIS — Z1231 Encounter for screening mammogram for malignant neoplasm of breast: Secondary | ICD-10-CM | POA: Diagnosis not present

## 2017-04-10 DIAGNOSIS — I69998 Other sequelae following unspecified cerebrovascular disease: Secondary | ICD-10-CM | POA: Diagnosis not present

## 2017-04-10 DIAGNOSIS — S098XXA Other specified injuries of head, initial encounter: Secondary | ICD-10-CM | POA: Diagnosis not present

## 2017-04-10 DIAGNOSIS — M25512 Pain in left shoulder: Secondary | ICD-10-CM | POA: Diagnosis not present

## 2017-04-10 DIAGNOSIS — S0121XA Laceration without foreign body of nose, initial encounter: Secondary | ICD-10-CM | POA: Diagnosis not present

## 2017-04-10 DIAGNOSIS — S022XXA Fracture of nasal bones, initial encounter for closed fracture: Secondary | ICD-10-CM | POA: Diagnosis not present

## 2017-04-10 DIAGNOSIS — K029 Dental caries, unspecified: Secondary | ICD-10-CM | POA: Diagnosis not present

## 2017-04-10 DIAGNOSIS — S0993XA Unspecified injury of face, initial encounter: Secondary | ICD-10-CM | POA: Diagnosis not present

## 2017-04-10 DIAGNOSIS — S022XXB Fracture of nasal bones, initial encounter for open fracture: Secondary | ICD-10-CM | POA: Diagnosis not present

## 2017-04-10 DIAGNOSIS — S0990XA Unspecified injury of head, initial encounter: Secondary | ICD-10-CM | POA: Diagnosis not present

## 2017-04-10 DIAGNOSIS — S199XXA Unspecified injury of neck, initial encounter: Secondary | ICD-10-CM | POA: Diagnosis not present

## 2017-04-10 DIAGNOSIS — W1839XA Other fall on same level, initial encounter: Secondary | ICD-10-CM | POA: Diagnosis not present

## 2017-04-10 DIAGNOSIS — Z743 Need for continuous supervision: Secondary | ICD-10-CM | POA: Diagnosis not present

## 2017-04-10 DIAGNOSIS — R279 Unspecified lack of coordination: Secondary | ICD-10-CM | POA: Diagnosis not present

## 2017-04-10 DIAGNOSIS — S4992XA Unspecified injury of left shoulder and upper arm, initial encounter: Secondary | ICD-10-CM | POA: Diagnosis not present

## 2017-04-18 DIAGNOSIS — Z4802 Encounter for removal of sutures: Secondary | ICD-10-CM | POA: Diagnosis not present

## 2017-04-19 DIAGNOSIS — I999 Unspecified disorder of circulatory system: Secondary | ICD-10-CM | POA: Diagnosis not present

## 2017-04-19 DIAGNOSIS — I10 Essential (primary) hypertension: Secondary | ICD-10-CM | POA: Diagnosis not present

## 2017-04-19 DIAGNOSIS — F419 Anxiety disorder, unspecified: Secondary | ICD-10-CM | POA: Diagnosis not present

## 2017-04-19 DIAGNOSIS — F0150 Vascular dementia without behavioral disturbance: Secondary | ICD-10-CM | POA: Diagnosis not present

## 2017-04-19 DIAGNOSIS — Z79899 Other long term (current) drug therapy: Secondary | ICD-10-CM | POA: Diagnosis not present

## 2017-04-19 DIAGNOSIS — F39 Unspecified mood [affective] disorder: Secondary | ICD-10-CM | POA: Diagnosis not present

## 2017-04-19 DIAGNOSIS — R413 Other amnesia: Secondary | ICD-10-CM | POA: Diagnosis not present

## 2017-04-24 DIAGNOSIS — Z79899 Other long term (current) drug therapy: Secondary | ICD-10-CM | POA: Diagnosis not present

## 2017-04-24 DIAGNOSIS — R413 Other amnesia: Secondary | ICD-10-CM | POA: Diagnosis not present

## 2017-05-09 DIAGNOSIS — F329 Major depressive disorder, single episode, unspecified: Secondary | ICD-10-CM | POA: Diagnosis not present

## 2017-05-09 DIAGNOSIS — F0151 Vascular dementia with behavioral disturbance: Secondary | ICD-10-CM | POA: Diagnosis not present

## 2017-05-23 DIAGNOSIS — K210 Gastro-esophageal reflux disease with esophagitis: Secondary | ICD-10-CM | POA: Diagnosis not present

## 2017-05-23 DIAGNOSIS — M5432 Sciatica, left side: Secondary | ICD-10-CM | POA: Diagnosis not present

## 2017-06-14 DIAGNOSIS — F419 Anxiety disorder, unspecified: Secondary | ICD-10-CM | POA: Diagnosis not present

## 2017-06-14 DIAGNOSIS — F39 Unspecified mood [affective] disorder: Secondary | ICD-10-CM | POA: Diagnosis not present

## 2017-06-14 DIAGNOSIS — F0391 Unspecified dementia with behavioral disturbance: Secondary | ICD-10-CM | POA: Diagnosis not present

## 2017-06-15 DIAGNOSIS — S32010A Wedge compression fracture of first lumbar vertebra, initial encounter for closed fracture: Secondary | ICD-10-CM | POA: Diagnosis not present

## 2017-06-15 DIAGNOSIS — M25551 Pain in right hip: Secondary | ICD-10-CM | POA: Diagnosis not present

## 2017-06-15 DIAGNOSIS — M545 Low back pain: Secondary | ICD-10-CM | POA: Diagnosis not present

## 2017-06-15 DIAGNOSIS — M47816 Spondylosis without myelopathy or radiculopathy, lumbar region: Secondary | ICD-10-CM | POA: Diagnosis not present

## 2017-06-29 DIAGNOSIS — M4856XG Collapsed vertebra, not elsewhere classified, lumbar region, subsequent encounter for fracture with delayed healing: Secondary | ICD-10-CM | POA: Diagnosis not present

## 2017-06-29 DIAGNOSIS — I69320 Aphasia following cerebral infarction: Secondary | ICD-10-CM | POA: Diagnosis not present

## 2017-07-05 DIAGNOSIS — M48061 Spinal stenosis, lumbar region without neurogenic claudication: Secondary | ICD-10-CM | POA: Diagnosis not present

## 2017-07-05 DIAGNOSIS — S32050A Wedge compression fracture of fifth lumbar vertebra, initial encounter for closed fracture: Secondary | ICD-10-CM | POA: Diagnosis not present

## 2017-07-05 DIAGNOSIS — S32000A Wedge compression fracture of unspecified lumbar vertebra, initial encounter for closed fracture: Secondary | ICD-10-CM | POA: Diagnosis not present

## 2017-07-05 DIAGNOSIS — M47816 Spondylosis without myelopathy or radiculopathy, lumbar region: Secondary | ICD-10-CM | POA: Diagnosis not present

## 2017-07-05 DIAGNOSIS — M4856XG Collapsed vertebra, not elsewhere classified, lumbar region, subsequent encounter for fracture with delayed healing: Secondary | ICD-10-CM | POA: Diagnosis not present

## 2017-07-09 DIAGNOSIS — Z8679 Personal history of other diseases of the circulatory system: Secondary | ICD-10-CM | POA: Diagnosis not present

## 2017-07-09 DIAGNOSIS — F0151 Vascular dementia with behavioral disturbance: Secondary | ICD-10-CM | POA: Diagnosis not present

## 2017-07-09 DIAGNOSIS — M439 Deforming dorsopathy, unspecified: Secondary | ICD-10-CM | POA: Diagnosis not present

## 2017-07-18 DIAGNOSIS — S32000G Wedge compression fracture of unspecified lumbar vertebra, subsequent encounter for fracture with delayed healing: Secondary | ICD-10-CM | POA: Diagnosis not present

## 2017-07-20 DIAGNOSIS — H10503 Unspecified blepharoconjunctivitis, bilateral: Secondary | ICD-10-CM | POA: Diagnosis not present

## 2017-07-26 DIAGNOSIS — F0151 Vascular dementia with behavioral disturbance: Secondary | ICD-10-CM | POA: Diagnosis not present

## 2017-07-26 DIAGNOSIS — F419 Anxiety disorder, unspecified: Secondary | ICD-10-CM | POA: Diagnosis not present

## 2017-07-26 DIAGNOSIS — F329 Major depressive disorder, single episode, unspecified: Secondary | ICD-10-CM | POA: Diagnosis not present

## 2017-07-26 DIAGNOSIS — F39 Unspecified mood [affective] disorder: Secondary | ICD-10-CM | POA: Diagnosis not present

## 2017-07-30 DIAGNOSIS — H10503 Unspecified blepharoconjunctivitis, bilateral: Secondary | ICD-10-CM | POA: Diagnosis not present

## 2017-08-01 DIAGNOSIS — S32040A Wedge compression fracture of fourth lumbar vertebra, initial encounter for closed fracture: Secondary | ICD-10-CM | POA: Diagnosis not present

## 2017-08-02 DIAGNOSIS — M545 Low back pain: Secondary | ICD-10-CM | POA: Diagnosis not present

## 2017-08-02 DIAGNOSIS — S32010A Wedge compression fracture of first lumbar vertebra, initial encounter for closed fracture: Secondary | ICD-10-CM | POA: Diagnosis not present

## 2017-08-02 DIAGNOSIS — S32040A Wedge compression fracture of fourth lumbar vertebra, initial encounter for closed fracture: Secondary | ICD-10-CM | POA: Diagnosis not present

## 2017-08-02 DIAGNOSIS — S32050A Wedge compression fracture of fifth lumbar vertebra, initial encounter for closed fracture: Secondary | ICD-10-CM | POA: Diagnosis not present

## 2017-08-02 DIAGNOSIS — S3992XA Unspecified injury of lower back, initial encounter: Secondary | ICD-10-CM | POA: Diagnosis not present

## 2017-08-14 ENCOUNTER — Other Ambulatory Visit (HOSPITAL_COMMUNITY): Payer: Self-pay | Admitting: Neurosurgery

## 2017-08-14 DIAGNOSIS — M859 Disorder of bone density and structure, unspecified: Secondary | ICD-10-CM | POA: Diagnosis not present

## 2017-08-14 DIAGNOSIS — S32040A Wedge compression fracture of fourth lumbar vertebra, initial encounter for closed fracture: Secondary | ICD-10-CM

## 2017-08-14 DIAGNOSIS — E2839 Other primary ovarian failure: Secondary | ICD-10-CM

## 2017-08-20 DIAGNOSIS — H10503 Unspecified blepharoconjunctivitis, bilateral: Secondary | ICD-10-CM | POA: Diagnosis not present

## 2017-08-23 ENCOUNTER — Ambulatory Visit (HOSPITAL_COMMUNITY)
Admission: RE | Admit: 2017-08-23 | Discharge: 2017-08-23 | Disposition: A | Discharge status: Home / Self Care | Payer: Medicare Other | Source: Ambulatory Visit | Attending: Neurosurgery | Admitting: Neurosurgery

## 2017-08-23 DIAGNOSIS — S32049A Unspecified fracture of fourth lumbar vertebra, initial encounter for closed fracture: Secondary | ICD-10-CM | POA: Insufficient documentation

## 2017-08-23 DIAGNOSIS — M85851 Other specified disorders of bone density and structure, right thigh: Secondary | ICD-10-CM | POA: Insufficient documentation

## 2017-08-23 DIAGNOSIS — S32040A Wedge compression fracture of fourth lumbar vertebra, initial encounter for closed fracture: Secondary | ICD-10-CM | POA: Diagnosis present

## 2017-08-23 DIAGNOSIS — E2839 Other primary ovarian failure: Secondary | ICD-10-CM

## 2017-08-23 DIAGNOSIS — X58XXXA Exposure to other specified factors, initial encounter: Secondary | ICD-10-CM | POA: Diagnosis not present

## 2017-08-23 DIAGNOSIS — M47816 Spondylosis without myelopathy or radiculopathy, lumbar region: Secondary | ICD-10-CM | POA: Insufficient documentation

## 2017-08-23 DIAGNOSIS — Z780 Asymptomatic menopausal state: Secondary | ICD-10-CM | POA: Diagnosis not present

## 2017-09-11 DIAGNOSIS — S32040A Wedge compression fracture of fourth lumbar vertebra, initial encounter for closed fracture: Secondary | ICD-10-CM | POA: Diagnosis not present

## 2017-09-20 DIAGNOSIS — F419 Anxiety disorder, unspecified: Secondary | ICD-10-CM | POA: Diagnosis not present

## 2017-09-20 DIAGNOSIS — F0391 Unspecified dementia with behavioral disturbance: Secondary | ICD-10-CM | POA: Diagnosis not present

## 2017-09-20 DIAGNOSIS — Z8673 Personal history of transient ischemic attack (TIA), and cerebral infarction without residual deficits: Secondary | ICD-10-CM | POA: Diagnosis not present

## 2017-09-20 DIAGNOSIS — F39 Unspecified mood [affective] disorder: Secondary | ICD-10-CM | POA: Diagnosis not present

## 2017-09-25 DIAGNOSIS — H47323 Drusen of optic disc, bilateral: Secondary | ICD-10-CM | POA: Diagnosis not present

## 2017-09-25 DIAGNOSIS — H35033 Hypertensive retinopathy, bilateral: Secondary | ICD-10-CM | POA: Diagnosis not present

## 2017-09-25 DIAGNOSIS — I10 Essential (primary) hypertension: Secondary | ICD-10-CM | POA: Diagnosis not present

## 2017-09-25 DIAGNOSIS — H524 Presbyopia: Secondary | ICD-10-CM | POA: Diagnosis not present

## 2017-09-25 DIAGNOSIS — H2513 Age-related nuclear cataract, bilateral: Secondary | ICD-10-CM | POA: Diagnosis not present

## 2017-10-18 DIAGNOSIS — Z23 Encounter for immunization: Secondary | ICD-10-CM | POA: Diagnosis not present

## 2017-10-18 DIAGNOSIS — Z1159 Encounter for screening for other viral diseases: Secondary | ICD-10-CM | POA: Diagnosis not present

## 2017-10-18 DIAGNOSIS — S32000G Wedge compression fracture of unspecified lumbar vertebra, subsequent encounter for fracture with delayed healing: Secondary | ICD-10-CM | POA: Diagnosis not present

## 2017-10-18 DIAGNOSIS — I10 Essential (primary) hypertension: Secondary | ICD-10-CM | POA: Diagnosis not present

## 2017-10-18 DIAGNOSIS — F250 Schizoaffective disorder, bipolar type: Secondary | ICD-10-CM | POA: Diagnosis not present

## 2017-10-18 DIAGNOSIS — K219 Gastro-esophageal reflux disease without esophagitis: Secondary | ICD-10-CM | POA: Diagnosis not present

## 2017-10-18 DIAGNOSIS — Z124 Encounter for screening for malignant neoplasm of cervix: Secondary | ICD-10-CM | POA: Diagnosis not present

## 2017-10-18 DIAGNOSIS — N949 Unspecified condition associated with female genital organs and menstrual cycle: Secondary | ICD-10-CM | POA: Diagnosis not present

## 2017-10-18 DIAGNOSIS — L304 Erythema intertrigo: Secondary | ICD-10-CM | POA: Diagnosis not present

## 2017-10-18 DIAGNOSIS — Z130 Encounter for screening for diseases of the blood and blood-forming organs and certain disorders involving the immune mechanism: Secondary | ICD-10-CM | POA: Diagnosis not present

## 2017-10-18 DIAGNOSIS — Z113 Encounter for screening for infections with a predominantly sexual mode of transmission: Secondary | ICD-10-CM | POA: Diagnosis not present

## 2017-10-18 DIAGNOSIS — Z1151 Encounter for screening for human papillomavirus (HPV): Secondary | ICD-10-CM | POA: Diagnosis not present

## 2017-10-18 DIAGNOSIS — R300 Dysuria: Secondary | ICD-10-CM | POA: Diagnosis not present

## 2017-11-12 DIAGNOSIS — R4189 Other symptoms and signs involving cognitive functions and awareness: Secondary | ICD-10-CM | POA: Diagnosis not present

## 2017-11-12 DIAGNOSIS — R413 Other amnesia: Secondary | ICD-10-CM | POA: Diagnosis not present

## 2017-12-11 DIAGNOSIS — M259 Joint disorder, unspecified: Secondary | ICD-10-CM | POA: Diagnosis not present

## 2017-12-11 DIAGNOSIS — S32040A Wedge compression fracture of fourth lumbar vertebra, initial encounter for closed fracture: Secondary | ICD-10-CM | POA: Diagnosis not present

## 2017-12-21 ENCOUNTER — Other Ambulatory Visit: Payer: Self-pay | Admitting: Neurosurgery

## 2017-12-21 DIAGNOSIS — M259 Joint disorder, unspecified: Secondary | ICD-10-CM

## 2017-12-27 DIAGNOSIS — F419 Anxiety disorder, unspecified: Secondary | ICD-10-CM | POA: Diagnosis not present

## 2017-12-27 DIAGNOSIS — F39 Unspecified mood [affective] disorder: Secondary | ICD-10-CM | POA: Diagnosis not present

## 2017-12-27 DIAGNOSIS — F0151 Vascular dementia with behavioral disturbance: Secondary | ICD-10-CM | POA: Diagnosis not present

## 2018-01-08 DIAGNOSIS — M25561 Pain in right knee: Secondary | ICD-10-CM | POA: Diagnosis not present

## 2018-01-08 DIAGNOSIS — W010XXA Fall on same level from slipping, tripping and stumbling without subsequent striking against object, initial encounter: Secondary | ICD-10-CM | POA: Diagnosis not present

## 2018-01-08 DIAGNOSIS — M25562 Pain in left knee: Secondary | ICD-10-CM | POA: Diagnosis not present

## 2018-01-08 DIAGNOSIS — T148XXA Other injury of unspecified body region, initial encounter: Secondary | ICD-10-CM | POA: Diagnosis not present

## 2018-01-08 DIAGNOSIS — S80212A Abrasion, left knee, initial encounter: Secondary | ICD-10-CM | POA: Diagnosis not present

## 2018-01-08 DIAGNOSIS — S80211A Abrasion, right knee, initial encounter: Secondary | ICD-10-CM | POA: Diagnosis not present

## 2018-01-08 DIAGNOSIS — F17200 Nicotine dependence, unspecified, uncomplicated: Secondary | ICD-10-CM | POA: Diagnosis not present

## 2018-01-30 DIAGNOSIS — F250 Schizoaffective disorder, bipolar type: Secondary | ICD-10-CM | POA: Diagnosis not present

## 2018-01-30 DIAGNOSIS — R29898 Other symptoms and signs involving the musculoskeletal system: Secondary | ICD-10-CM | POA: Diagnosis not present

## 2018-01-30 DIAGNOSIS — F0151 Vascular dementia with behavioral disturbance: Secondary | ICD-10-CM | POA: Diagnosis not present

## 2018-01-30 DIAGNOSIS — R296 Repeated falls: Secondary | ICD-10-CM | POA: Diagnosis not present

## 2018-01-30 DIAGNOSIS — M25562 Pain in left knee: Secondary | ICD-10-CM | POA: Diagnosis not present

## 2018-01-30 DIAGNOSIS — Z23 Encounter for immunization: Secondary | ICD-10-CM | POA: Diagnosis not present

## 2018-01-30 DIAGNOSIS — R531 Weakness: Secondary | ICD-10-CM | POA: Diagnosis not present

## 2018-01-30 DIAGNOSIS — Z9181 History of falling: Secondary | ICD-10-CM | POA: Diagnosis not present

## 2018-02-05 DIAGNOSIS — F0390 Unspecified dementia without behavioral disturbance: Secondary | ICD-10-CM | POA: Diagnosis not present

## 2018-02-05 DIAGNOSIS — F330 Major depressive disorder, recurrent, mild: Secondary | ICD-10-CM | POA: Diagnosis not present

## 2018-02-07 ENCOUNTER — Ambulatory Visit
Admission: RE | Admit: 2018-02-07 | Discharge: 2018-02-07 | Disposition: A | Discharge status: Home / Self Care | Payer: Medicare Other | Source: Ambulatory Visit | Attending: Neurosurgery | Admitting: Neurosurgery

## 2018-02-07 DIAGNOSIS — M259 Joint disorder, unspecified: Secondary | ICD-10-CM

## 2018-02-07 DIAGNOSIS — M25462 Effusion, left knee: Secondary | ICD-10-CM | POA: Diagnosis not present

## 2018-02-14 DIAGNOSIS — S32040A Wedge compression fracture of fourth lumbar vertebra, initial encounter for closed fracture: Secondary | ICD-10-CM | POA: Diagnosis not present

## 2018-02-14 DIAGNOSIS — M259 Joint disorder, unspecified: Secondary | ICD-10-CM | POA: Diagnosis not present

## 2018-02-19 DIAGNOSIS — F330 Major depressive disorder, recurrent, mild: Secondary | ICD-10-CM | POA: Diagnosis not present

## 2018-02-19 DIAGNOSIS — F0390 Unspecified dementia without behavioral disturbance: Secondary | ICD-10-CM | POA: Diagnosis not present
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
## 2018-03-21 ENCOUNTER — Ambulatory Visit (HOSPITAL_COMMUNITY): Payer: Self-pay | Admitting: Psychiatry

## 2018-04-05 DIAGNOSIS — F419 Anxiety disorder, unspecified: Secondary | ICD-10-CM | POA: Diagnosis not present

## 2018-04-05 DIAGNOSIS — F418 Other specified anxiety disorders: Secondary | ICD-10-CM | POA: Diagnosis not present

## 2018-04-05 DIAGNOSIS — F0391 Unspecified dementia with behavioral disturbance: Secondary | ICD-10-CM | POA: Diagnosis not present

## 2018-06-17 DIAGNOSIS — Z8679 Personal history of other diseases of the circulatory system: Secondary | ICD-10-CM | POA: Diagnosis not present

## 2018-06-17 DIAGNOSIS — Z9889 Other specified postprocedural states: Secondary | ICD-10-CM | POA: Diagnosis not present

## 2018-06-17 DIAGNOSIS — F0391 Unspecified dementia with behavioral disturbance: Secondary | ICD-10-CM | POA: Diagnosis not present

## 2018-06-17 DIAGNOSIS — F39 Unspecified mood [affective] disorder: Secondary | ICD-10-CM | POA: Diagnosis not present

## 2018-06-17 DIAGNOSIS — Z79899 Other long term (current) drug therapy: Secondary | ICD-10-CM | POA: Diagnosis not present

## 2018-08-02 ENCOUNTER — Other Ambulatory Visit: Payer: Self-pay | Admitting: Family Medicine

## 2018-08-09 ENCOUNTER — Other Ambulatory Visit: Payer: Self-pay | Admitting: Family Medicine

## 2018-08-15 DIAGNOSIS — F39 Unspecified mood [affective] disorder: Secondary | ICD-10-CM | POA: Diagnosis not present

## 2018-08-15 DIAGNOSIS — F0391 Unspecified dementia with behavioral disturbance: Secondary | ICD-10-CM | POA: Diagnosis not present

## 2018-08-15 DIAGNOSIS — Z8679 Personal history of other diseases of the circulatory system: Secondary | ICD-10-CM | POA: Diagnosis not present

## 2018-08-15 DIAGNOSIS — Z79899 Other long term (current) drug therapy: Secondary | ICD-10-CM | POA: Diagnosis not present

## 2018-08-15 DIAGNOSIS — Z9889 Other specified postprocedural states: Secondary | ICD-10-CM | POA: Diagnosis not present

## 2018-09-05 DIAGNOSIS — Z0000 Encounter for general adult medical examination without abnormal findings: Secondary | ICD-10-CM | POA: Diagnosis not present

## 2018-09-05 DIAGNOSIS — I10 Essential (primary) hypertension: Secondary | ICD-10-CM | POA: Diagnosis not present

## 2018-09-05 DIAGNOSIS — Z23 Encounter for immunization: Secondary | ICD-10-CM | POA: Diagnosis not present

## 2018-09-05 DIAGNOSIS — F250 Schizoaffective disorder, bipolar type: Secondary | ICD-10-CM | POA: Diagnosis not present

## 2019-02-24 DIAGNOSIS — F0391 Unspecified dementia with behavioral disturbance: Secondary | ICD-10-CM | POA: Diagnosis not present

## 2019-02-24 DIAGNOSIS — F39 Unspecified mood [affective] disorder: Secondary | ICD-10-CM | POA: Diagnosis not present

## 2019-02-24 DIAGNOSIS — Z9889 Other specified postprocedural states: Secondary | ICD-10-CM | POA: Diagnosis not present

## 2019-02-24 DIAGNOSIS — Z8679 Personal history of other diseases of the circulatory system: Secondary | ICD-10-CM | POA: Diagnosis not present

## 2019-02-24 DIAGNOSIS — Z79899 Other long term (current) drug therapy: Secondary | ICD-10-CM | POA: Diagnosis not present

## 2019-03-13 DIAGNOSIS — Z13220 Encounter for screening for lipoid disorders: Secondary | ICD-10-CM | POA: Diagnosis not present

## 2019-03-13 DIAGNOSIS — M25559 Pain in unspecified hip: Secondary | ICD-10-CM | POA: Diagnosis not present

## 2019-03-13 DIAGNOSIS — R7302 Impaired glucose tolerance (oral): Secondary | ICD-10-CM | POA: Diagnosis not present

## 2019-03-13 DIAGNOSIS — R5383 Other fatigue: Secondary | ICD-10-CM | POA: Diagnosis not present

## 2019-03-13 DIAGNOSIS — Z0000 Encounter for general adult medical examination without abnormal findings: Secondary | ICD-10-CM | POA: Diagnosis not present

## 2019-03-13 DIAGNOSIS — Z136 Encounter for screening for cardiovascular disorders: Secondary | ICD-10-CM | POA: Diagnosis not present

## 2019-03-13 DIAGNOSIS — Z23 Encounter for immunization: Secondary | ICD-10-CM | POA: Diagnosis not present

## 2019-05-21 DIAGNOSIS — Z9889 Other specified postprocedural states: Secondary | ICD-10-CM | POA: Diagnosis not present

## 2019-05-21 DIAGNOSIS — F39 Unspecified mood [affective] disorder: Secondary | ICD-10-CM | POA: Diagnosis not present

## 2019-05-21 DIAGNOSIS — Z8679 Personal history of other diseases of the circulatory system: Secondary | ICD-10-CM | POA: Diagnosis not present

## 2019-05-21 DIAGNOSIS — Z79899 Other long term (current) drug therapy: Secondary | ICD-10-CM | POA: Diagnosis not present

## 2019-05-21 DIAGNOSIS — F419 Anxiety disorder, unspecified: Secondary | ICD-10-CM | POA: Diagnosis not present

## 2019-05-21 DIAGNOSIS — F0391 Unspecified dementia with behavioral disturbance: Secondary | ICD-10-CM | POA: Diagnosis not present

## 2019-05-27 DIAGNOSIS — Z8673 Personal history of transient ischemic attack (TIA), and cerebral infarction without residual deficits: Secondary | ICD-10-CM | POA: Diagnosis not present

## 2019-05-27 DIAGNOSIS — F0390 Unspecified dementia without behavioral disturbance: Secondary | ICD-10-CM | POA: Diagnosis not present

## 2019-05-27 DIAGNOSIS — Z87891 Personal history of nicotine dependence: Secondary | ICD-10-CM | POA: Diagnosis not present

## 2019-05-27 DIAGNOSIS — F419 Anxiety disorder, unspecified: Secondary | ICD-10-CM | POA: Diagnosis not present

## 2019-05-27 DIAGNOSIS — K047 Periapical abscess without sinus: Secondary | ICD-10-CM | POA: Diagnosis not present

## 2019-05-27 DIAGNOSIS — I10 Essential (primary) hypertension: Secondary | ICD-10-CM | POA: Diagnosis not present

## 2019-11-14 DIAGNOSIS — F419 Anxiety disorder, unspecified: Secondary | ICD-10-CM | POA: Diagnosis not present

## 2019-11-14 DIAGNOSIS — F39 Unspecified mood [affective] disorder: Secondary | ICD-10-CM | POA: Diagnosis not present

## 2019-12-12 DIAGNOSIS — F39 Unspecified mood [affective] disorder: Secondary | ICD-10-CM | POA: Diagnosis not present

## 2019-12-12 DIAGNOSIS — F419 Anxiety disorder, unspecified: Secondary | ICD-10-CM | POA: Diagnosis not present

## 2020-05-12 IMAGING — MR MR KNEE*L* W/O CM
4 of 7 series · 23 of 40 positions shown · non-contrast
Comparison: Radiographs 01/08/2018

CLINICAL DATA: Fell.  Left knee pain.

EXAM:
MRI OF THE LEFT KNEE WITHOUT CONTRAST
TECHNIQUE: Multiplanar, multisequence MR imaging of the knee was performed. No
intravenous contrast was administered.

[Series 4: T2 fat-sat · coronal · 4.0mm · 0.66mm/px · 6 of 33 slices shown (1 of 2)]
[im 1/33]
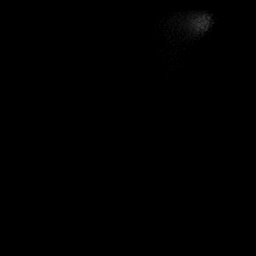
[im 7/33]
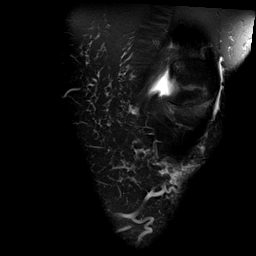
[im 13/33]
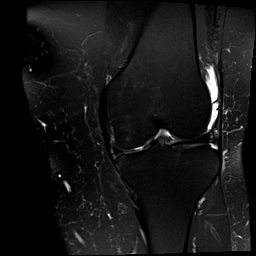
[im 20/33]
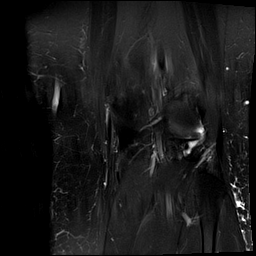
[im 26/33]
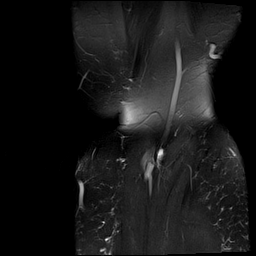
[im 33/33]
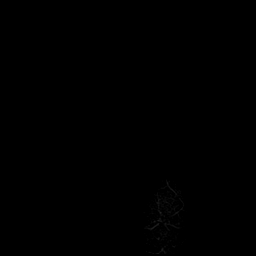

[Series 5: T1 · coronal · 4.0mm · 0.33mm/px · 3 of 33 slices shown]
[im 7/33]
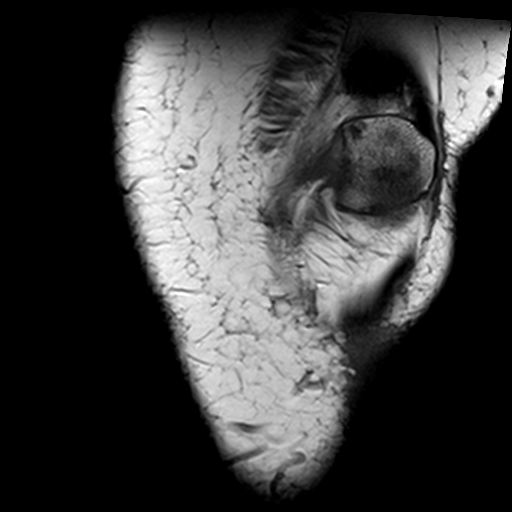
[im 20/33]
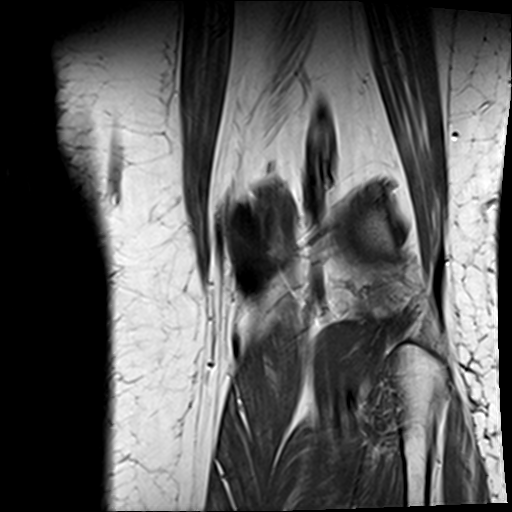
[im 33/33]
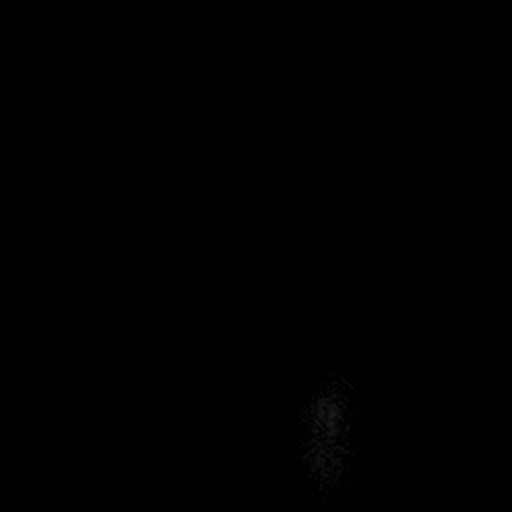

[Series 7: PD fat-sat · sagittal · 3.0mm · 0.35mm/px · 7 of 38 slices shown]
[im 1/38]
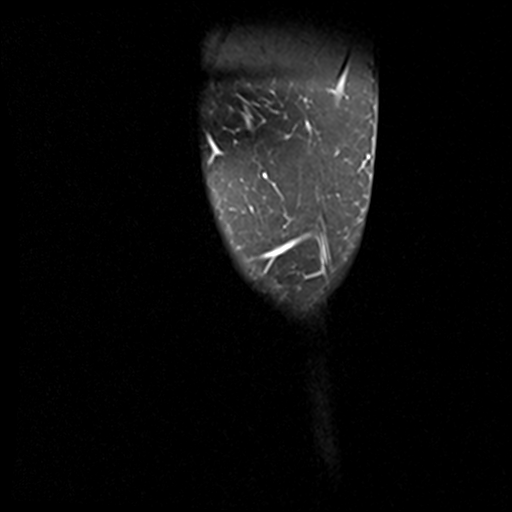
[im 7/38]
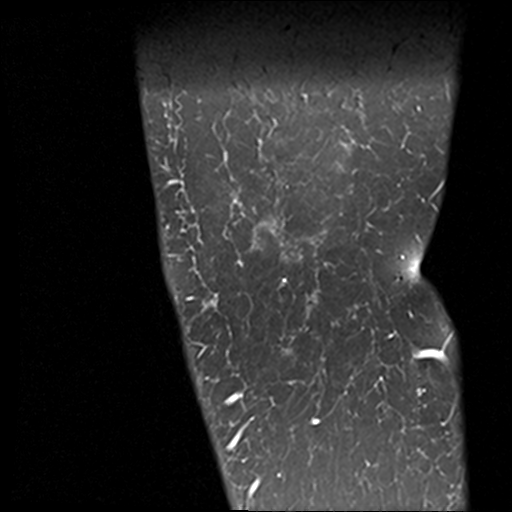
[im 13/38]
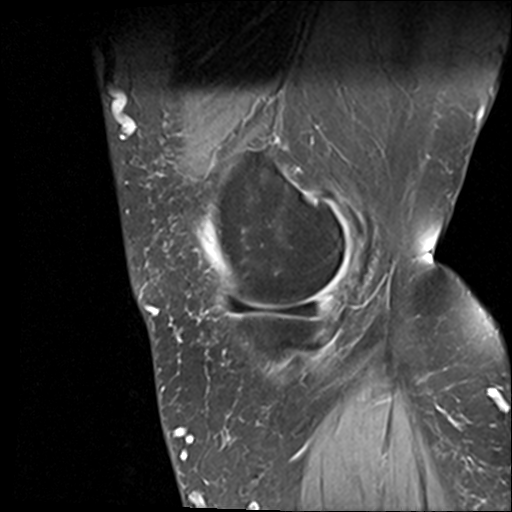
[im 19/38]
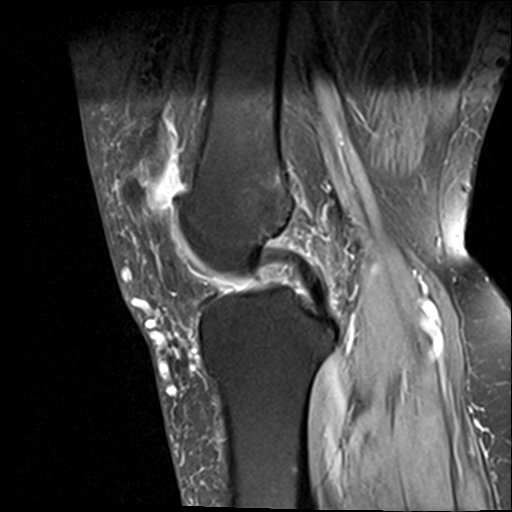
[im 25/38]
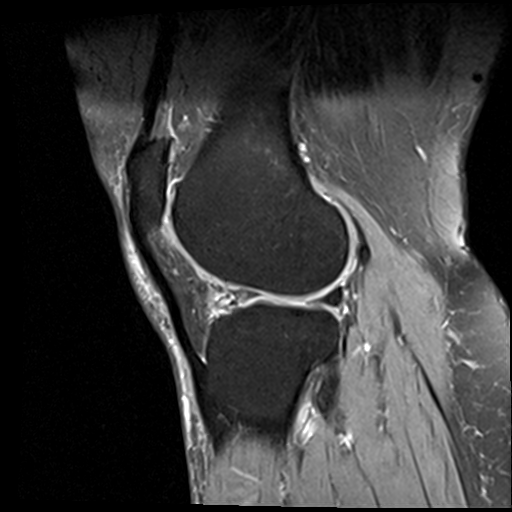
[im 31/38]
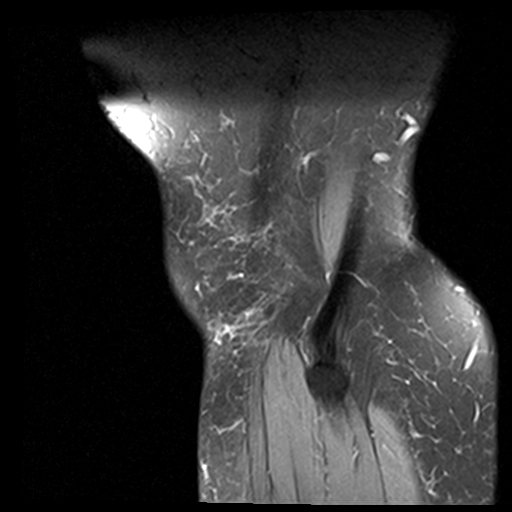
[im 38/38]
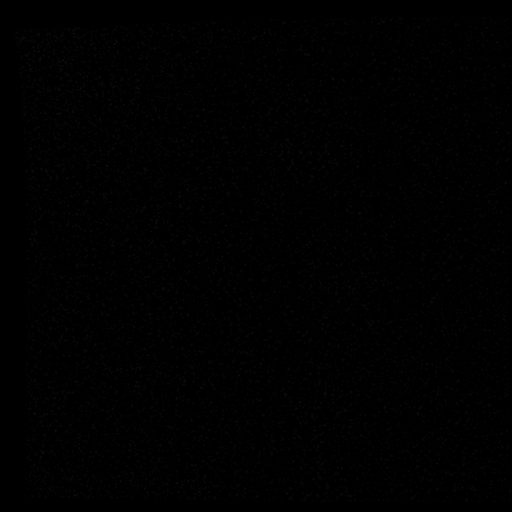

[Series 8: T2 fat-sat · sagittal · 3.0mm · 0.35mm/px · 7 of 39 slices shown (2 of 2)]
[im 1/39]
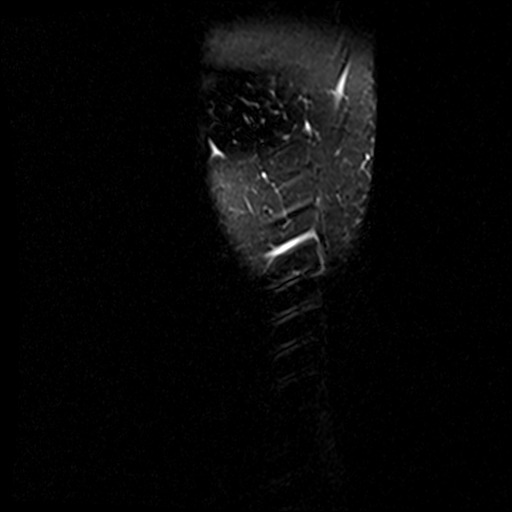
[im 7/39]
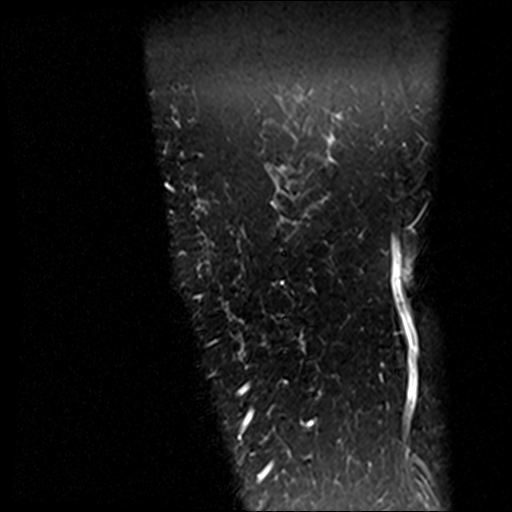
[im 13/39]
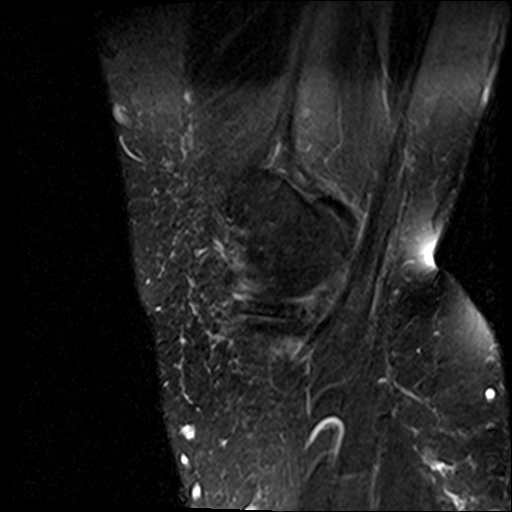
[im 20/39]
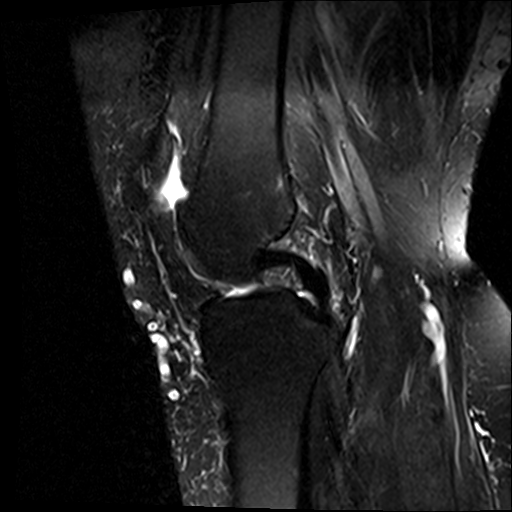
[im 26/39]
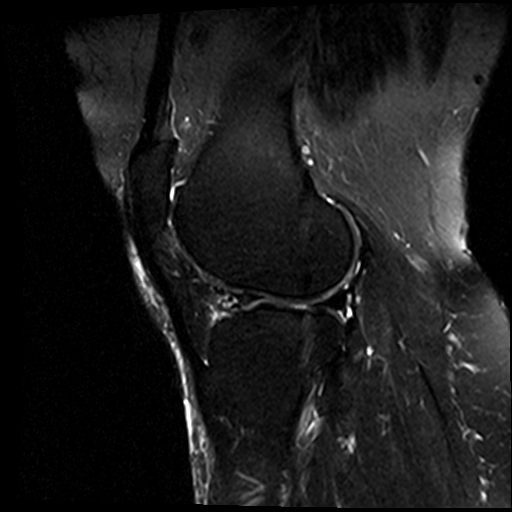
[im 32/39]
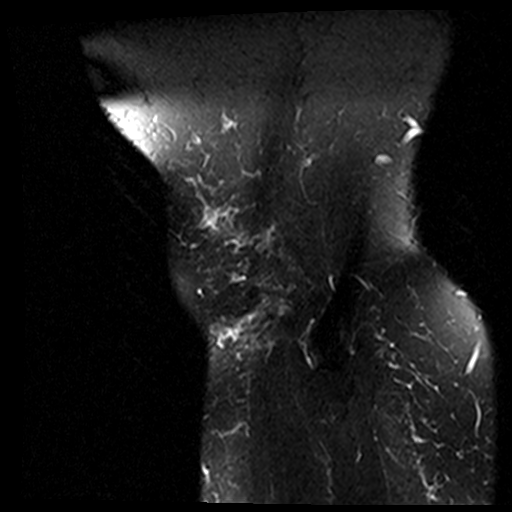
[im 39/39]
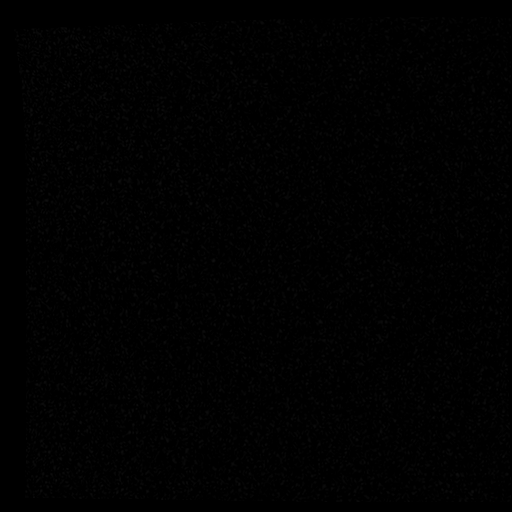

[23 of 40 positions shown; findings below may reference images not displayed]

FINDINGS: Exam is degraded by patient motion.

MENISCI

Medial meniscus:  No definite meniscal tear.

Lateral meniscus: Degenerative fraying type changes without definite
tear.

LIGAMENTS

Cruciates:  Intact

Collaterals:  Intact

CARTILAGE

Patellofemoral: Moderate to advanced degenerative chondrosis with
areas of full or near full-thickness cartilage loss mainly along the
patellar apex and lateral facet. There is also joint space narrowing
and spurring.

Medial: Moderate degenerative chondrosis with early joint space
narrowing and spurring.

Lateral: Mild to moderate degenerative chondrosis with early
spurring.

Joint:  Small joint effusion.

Popliteal Fossa:  No popliteal mass or Baker's cyst.

Extensor Mechanism: The patella retinacular structures are intact
and the quadriceps and patellar tendons are intact. Mild lateral
tilt and orientation of the patella likely due to body habitus.

Bones:  No acute bony findings.  No osteochondral abnormality.

Other: Normal knee musculature.
IMPRESSION: 1. Exam is somewhat limited by patient motion.
2. Intact ligamentous structures and no acute bony findings.
3. No definite meniscal tears.
4. Tricompartmental degenerative changes, most severe at the
patellofemoral joint.

## 2022-12-10 DEATH — deceased
